# Patient Record
Sex: Female | Born: 1980 | Race: White | Hispanic: No | Smoking: Never smoker
Health system: Southern US, Community
[De-identification: ages and names within clinical notes are randomized; demographics above are authoritative.]

## PROBLEM LIST (undated history)

## (undated) DIAGNOSIS — R519 Headache, unspecified: Secondary | ICD-10-CM

## (undated) DIAGNOSIS — R51 Headache: Secondary | ICD-10-CM

## (undated) DIAGNOSIS — Z8669 Personal history of other diseases of the nervous system and sense organs: Secondary | ICD-10-CM

## (undated) DIAGNOSIS — T7840XA Allergy, unspecified, initial encounter: Secondary | ICD-10-CM

## (undated) HISTORY — DX: Personal history of other diseases of the nervous system and sense organs: Z86.69

## (undated) HISTORY — DX: Headache: R51

## (undated) HISTORY — DX: Headache, unspecified: R51.9

## (undated) HISTORY — DX: Allergy, unspecified, initial encounter: T78.40XA

---

## 2001-01-14 ENCOUNTER — Other Ambulatory Visit: Admission: RE | Admit: 2001-01-14 | Discharge: 2001-01-14 | Payer: Self-pay | Admitting: Obstetrics and Gynecology

## 2002-01-04 ENCOUNTER — Emergency Department (HOSPITAL_COMMUNITY): Admission: EM | Admit: 2002-01-04 | Discharge: 2002-01-04 | Payer: Self-pay | Admitting: Emergency Medicine

## 2002-01-04 ENCOUNTER — Other Ambulatory Visit: Admission: RE | Admit: 2002-01-04 | Discharge: 2002-01-04 | Payer: Self-pay | Admitting: Obstetrics and Gynecology

## 2002-11-21 ENCOUNTER — Emergency Department (HOSPITAL_COMMUNITY): Admission: EM | Admit: 2002-11-21 | Discharge: 2002-11-21 | Payer: Self-pay | Admitting: Emergency Medicine

## 2002-12-14 ENCOUNTER — Inpatient Hospital Stay (HOSPITAL_COMMUNITY): Admission: AD | Admit: 2002-12-14 | Discharge: 2002-12-18 | Payer: Self-pay | Admitting: Obstetrics and Gynecology

## 2002-12-15 ENCOUNTER — Encounter: Payer: Self-pay | Admitting: Obstetrics and Gynecology

## 2009-05-02 ENCOUNTER — Ambulatory Visit: Payer: Self-pay | Admitting: Family Medicine

## 2009-05-02 ENCOUNTER — Other Ambulatory Visit: Admission: RE | Admit: 2009-05-02 | Discharge: 2009-05-02 | Payer: Self-pay | Admitting: Family Medicine

## 2009-06-15 ENCOUNTER — Ambulatory Visit: Payer: Self-pay | Admitting: Family Medicine

## 2009-09-13 ENCOUNTER — Ambulatory Visit: Payer: Self-pay | Admitting: Family Medicine

## 2010-01-31 ENCOUNTER — Ambulatory Visit: Payer: Self-pay | Admitting: Family Medicine

## 2010-11-22 NOTE — Discharge Summary (Signed)
   NAME:  Jillian Valdez, Jillian Valdez                       ACCOUNT NO.:  [id number]   MEDICAL RECORD NO.:  [id number]                   PATIENT TYPE:  INP   LOCATION:  A325                                 FACILITY:  [facility]   PHYSICIAN:  Tilda Burrow, M.D.              DATE OF BIRTH:  [birth year]   DATE OF ADMISSION:  12/14/2002  DATE OF DISCHARGE:                                 DISCHARGE SUMMARY   ADMITTING DIAGNOSES:  Pelvic inflammatory disease (salpingitis).   DISCHARGE DIAGNOSES:  Questionable pelvic inflammatory disease  (salpingitis).   DISCHARGE MEDICATIONS:  Doxycycline 100 mg p.o. t.i.d. for 5 days--patient  and partner.   BRIEF HISTORY:  See HPI for lengthy details. This 30 year old female was  admitted for IV antibiotic therapy after 2 office visits for severe sharp  lower abdominal pain accompanied on the initial day with temperature to 102,  low-grade the day prior to admission with lower abdominal pain showing  bilateral guarding. The patient had a negative GC, Chlamydia, hepatitis,  HIV, and RPR in 2002.  Most recently she has had severe incapacitating  pelvic pain.  CBC and sedimentation rate were obtained upon admission.  The  patient was treated in July for pelvic discomfort, and in July 2003 with  Suprax and Zithromax for pelvic discomfort.   HOSPITAL COURSE:  The patient was admitted for what was suspected to be low-  grade, chronic PID.   Laboratory data included a white count of 6600, hemoglobin 11.5, hematocrit  32.9.  Sedimentation rate was performed December 16, 2002 and was completely  normal at 2 (normal 0-25).   The patient remained afebrile throughout the hospitalization.  Nonetheless  she improved slowly in her lower abdominal discomfort with the pain  lateralizing to the left side after 1 day with the right side perceived as  significantly less tender. Then, by the day of discharge she was able to  tolerate lower abdominal exam in both quadrants without any  guarding or  rebound.  The uncertain nature of whether she had PID since there were no  positive cultures was a bit of a diagnostic problem; but, we felt, that  discharge on the antibiotics for patient and partner was the prudent  discharge plan.   She will be followed up in 1 week for assessment including pelvic exam to  confirm complete resolution of symptoms and follow up p.r.n. Should  recurrent episodes of pain occur, diagnostic laparoscopy is a strong  consideration.                                               Tilda Burrow, M.D.    JVF/MEDQ  D:  12/18/2002  T:  12/18/2002  Job:  [id number]

## 2010-11-22 NOTE — H&P (Signed)
NAME:  Jillian Valdez, Jillian Valdez NO.:  [id number]   MEDICAL RECORD NO.:  [id number]                  PATIENT TYPE:   LOCATION:                                       FACILITY:   PHYSICIAN:  Tilda Burrow, M.D.              DATE OF BIRTH:   DATE OF ADMISSION:  DATE OF DISCHARGE:                                HISTORY & PHYSICAL   ADMISSION DIAGNOSIS:  Pelvic inflammatory disease (salpingitis).   HISTORY OF PRESENT ILLNESS:  This 30 year old female was admitted for IV  antibiotic therapy after being seen in our office both June 7 and December 14, 2002, for severe sharp low abdominal pain in the stomach and lower back,  which has been acutely worse since Saturday, December 10, 2002, when the patient  had a temperature to 102 with the patient both having vomiting on December 11, 2002, with two loose bowel movements that night.  Her temperature was  improved on June 6 with a temperature maximum at 100.0 but staying high of  the 24 hours.  Jillian Valdez had been seen in our office two months before  complaining of irregular cycle issues and had a diagnosis of dysfunctional  bleeding and suspected vaginal infection due to uterine tenderness and was  treated with doxycycline 100 mg b.i.d. for seven days.  There is no mention  of treatment of partner.  The patient has been in a single-partner  relationship for six months, and previously was married and had only one  partner for four years.  Incidentally, there was a reason for the break-up  of the marriage.   The break-up occurred in September 2004.  Screening in September 2004 was  performed for all STDs and at that point in time, she had negative GC,  Chlamydia, hepatitis, HIV, and RPR.  To her knowledge, her current  relationship is mutually monogamous.  GYN history also notable for  dysfunctional uterine bleeding during early July, notable for a treatment of  January 04, 2002, with Suprax and  Zithromax for pelvic discomfort.   The recent pain has been the most severely incapacitating pelvic pain ever,  and on exam the patient has referred pain from the upper abdomen to the  lower abdomen on palpation and has guarding in both lower quadrants and no  rebound tenderness.  CBC and sedimentation rate are pending.  The patient is  afebrile.   GENERAL:  A slim, healthy-appearing Caucasian female, alert and oriented x3,  in obvious discomfort, able to walk slowly, leaning forward in  characteristic fashion.  ABDOMEN:  Bowel sounds in upper quadrants greater than lower quadrants,  guarding in both lower quadrants without rebound tenderness.  PELVIC:  External genitalia shows some folliculitis around the areas of  shaving of the labia majora.  Periurethral tissues are normal.  The cervix  has a heavy mucoid discharge without purulent.  The uterus is anteflexed,  tenderness of  the cervix is 3/5.  Adnexa are diffusely tender without  rebound or masses.   IMPRESSION:  Low-grade pelvic inflammatory disease, possible long-standing  duration.   PLAN:  1. Admit to hospital.  2. Follow up CBC and sedimentation rate.  3.     IV doxycycline.  4. Antibiotic treatment as well as outpatient therapy to follow.  5. If the patient is not better in 48-72 hours, will consider laparoscopy.                                               Tilda Burrow, M.D.    JVF/MEDQ  D:  12/14/2002  T:  12/14/2002  Job:  [id number]

## 2011-05-01 ENCOUNTER — Encounter: Payer: Self-pay | Admitting: Family Medicine

## 2014-01-24 ENCOUNTER — Emergency Department (HOSPITAL_COMMUNITY): Payer: 59

## 2014-01-24 ENCOUNTER — Emergency Department (HOSPITAL_COMMUNITY)
Admission: EM | Admit: 2014-01-24 | Discharge: 2014-01-24 | Disposition: A | Discharge status: Home / Self Care | Payer: 59 | Attending: Emergency Medicine | Admitting: Emergency Medicine

## 2014-01-24 ENCOUNTER — Encounter (HOSPITAL_COMMUNITY): Payer: Self-pay | Admitting: Emergency Medicine

## 2014-01-24 DIAGNOSIS — S12000A Unspecified displaced fracture of first cervical vertebra, initial encounter for closed fracture: Secondary | ICD-10-CM

## 2014-01-24 DIAGNOSIS — Z8679 Personal history of other diseases of the circulatory system: Secondary | ICD-10-CM | POA: Insufficient documentation

## 2014-01-24 DIAGNOSIS — Y929 Unspecified place or not applicable: Secondary | ICD-10-CM | POA: Insufficient documentation

## 2014-01-24 DIAGNOSIS — R079 Chest pain, unspecified: Secondary | ICD-10-CM | POA: Insufficient documentation

## 2014-01-24 DIAGNOSIS — Y9389 Activity, other specified: Secondary | ICD-10-CM | POA: Insufficient documentation

## 2014-01-24 DIAGNOSIS — Z3202 Encounter for pregnancy test, result negative: Secondary | ICD-10-CM | POA: Insufficient documentation

## 2014-01-24 DIAGNOSIS — X500XXA Overexertion from strenuous movement or load, initial encounter: Secondary | ICD-10-CM | POA: Insufficient documentation

## 2014-01-24 DIAGNOSIS — Z8709 Personal history of other diseases of the respiratory system: Secondary | ICD-10-CM | POA: Insufficient documentation

## 2014-01-24 LAB — POC URINE PREG, ED: Preg Test, Ur: NEGATIVE

## 2014-01-24 MED ORDER — METHOCARBAMOL 500 MG PO TABS: 500.0000 mg | ORAL_TABLET | Freq: Three times a day (TID) | ORAL | Status: DC

## 2014-01-24 MED ORDER — OXYCODONE-ACETAMINOPHEN 5-325 MG PO TABS: 1.0000 | ORAL_TABLET | Freq: Four times a day (QID) | ORAL | Status: DC | PRN

## 2014-01-24 MED ORDER — DICLOFENAC SODIUM 75 MG PO TBEC: 75.0000 mg | DELAYED_RELEASE_TABLET | Freq: Two times a day (BID) | ORAL | Status: DC

## 2014-01-24 NOTE — ED Notes (Signed)
Neck pain on Saturday and hear popping after sudden movement.  Rates pain 10.

## 2014-01-24 NOTE — ED Provider Notes (Signed)
Medical screening examination/treatment/procedure(s) were conducted as a shared visit with non-physician practitioner(s) and myself.  I personally evaluated the patient during the encounter.  33 year old female suddenly turned her head to the right 3 days ago and has had neck pain since that time with some radiation toward her left upper arm with some slight numbness and tingling to her left upper arm without weakness to her arms or legs without change in bowel or bladder function and without numbness or tingling to her hands or feet. CT scan shows essentially nondisplaced lateral fracture of the right side of C1.    Hurman HornJohn M Barbarajean Kinzler, MD 01/28/14 [phone]

## 2014-01-24 NOTE — Discharge Instructions (Signed)
Your CT scan suggests a fracture of your C1 vertebrae. Please leave the Philadelphia collar in place until seen by neurosurgery. Please use Robaxin and diclofenac daily. Use Percocet every 6 hours if needed for pain. Please take these medications with. Robaxin and Percocet may cause drowsiness, please use with caution. Please see Dr. Venetia MaxonStern (neurosurgery) in one week. Please return to the emergency department if any emergent changes, problems, or concerns.
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
## 2015-06-12 ENCOUNTER — Encounter: Payer: Self-pay | Admitting: *Deleted

## 2015-06-13 ENCOUNTER — Encounter: Payer: Self-pay | Admitting: Diagnostic Neuroimaging

## 2015-06-13 ENCOUNTER — Ambulatory Visit (INDEPENDENT_AMBULATORY_CARE_PROVIDER_SITE_OTHER): Payer: BLUE CROSS/BLUE SHIELD | Admitting: Diagnostic Neuroimaging

## 2015-06-13 VITALS — BP 111/67 | HR 73 | Ht 68.0 in | Wt 146.0 lb

## 2015-06-13 DIAGNOSIS — G43709 Chronic migraine without aura, not intractable, without status migrainosus: Secondary | ICD-10-CM | POA: Diagnosis not present

## 2015-06-13 MED ORDER — GABAPENTIN 300 MG PO CAPS: 300.0000 mg | ORAL_CAPSULE | Freq: Three times a day (TID) | ORAL | Status: DC

## 2015-06-13 NOTE — Patient Instructions (Signed)
Thank you for coming to see [place] at [facility]. I hope we have been able to provide you high quality care today.  You may receive a patient satisfaction survey over the next few weeks. We would appreciate your feedback and comments so that we may continue to improve ourselves and the health of our patients.  - start gabapentin 329m at bedtime; after 1-2 weeks increase to twice a day   ~~~~~~~~~~~~~~~~~~~~~~~~~~~~~~~~~~~~~~~~~~~~~~~~~~~~~~~~~~~~~~~~~  DR. Evona Westra'S GUIDE TO HAPPY AND HEALTHY LIVING These are some of my general health and wellness recommendations. Some of them may apply to you better than others. Please use common sense as you try these suggestions and feel free to ask me any questions.   ACTIVITY/FITNESS Mental, social, emotional and physical stimulation are very important for brain and body health. Try learning a new activity (arts, music, language, sports, games).  Keep moving your body to the best of your abilities. You can do this at home, inside or outside, the park, community center, gym or anywhere you like. Consider a physical therapist or personal trainer to get started. Consider the app Sworkit. Fitness trackers such as smart-watches, smart-phones or Fitbits can help as well.   NUTRITION Eat more plants: colorful vegetables, nuts, seeds and berries.  Eat less sugar, salt, preservatives and processed foods.  Avoid toxins such as cigarettes and alcohol.  Drink water when you are thirsty. Warm water with a slice of lemon is an excellent morning drink to start the day.  Consider these websites for more information The Nutrition Source (hhttps://www.henry-hernandez.biz/ Precision Nutrition (wWindowBlog.ch   RELAXATION Consider practicing mindfulness meditation or other relaxation techniques such as deep breathing, prayer, yoga, tai chi, massage. See website mindful.org or the apps Headspace or Calm  to help get started.   SLEEP Try to get at least 7-8+ hours sleep per day. Regular exercise and reduced caffeine will help you sleep better. Practice good sleep hygeine techniques. See website sleep.org for more information.   PLANNING Prepare estate planning, living will, healthcare POA documents. Sometimes this is best planned with the help of an attorney. Theconversationproject.org and agingwithdignity.org are excellent resources.
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
## 2015-07-08 IMAGING — CT CT CERVICAL SPINE W/O CM
3 of 4 series · 12 of 33 positions shown, 14 images · non-contrast
Comparison: None.

CLINICAL DATA: Neck pain after quickly turning head for 3 days,
heard a pop now with severe pain with rotation of neck and pulling
sensation to left shoulder ; history of neck fracture 10 years ago

EXAM:
CT CERVICAL SPINE WITHOUT CONTRAST
TECHNIQUE: Multidetector CT imaging of the cervical spine was performed without
intravenous contrast. Multiplanar CT image reconstructions were also
generated.

[Series 3: cervical 2.0 st axial · axial · 0.27mm/px · z∈[+1060,+1164]mm · 4 of 79 slices shown, 5 images]
[im 14/79  soft-tissue]
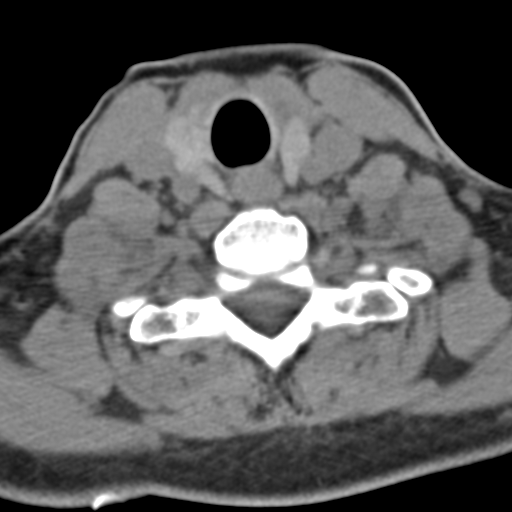
[im 14/79  bone]
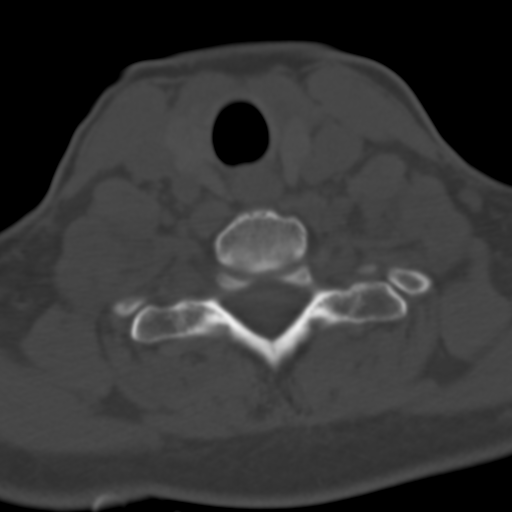
[im 27/79  bone]
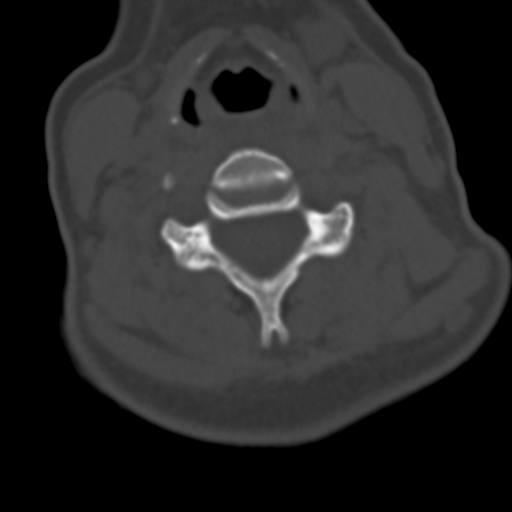
[im 53/79  bone]
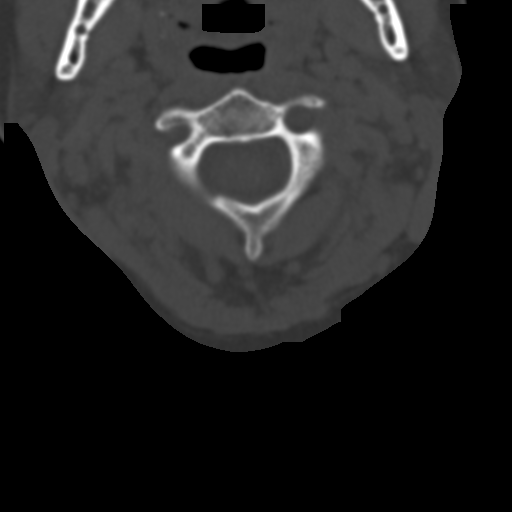
[im 66/79  bone]
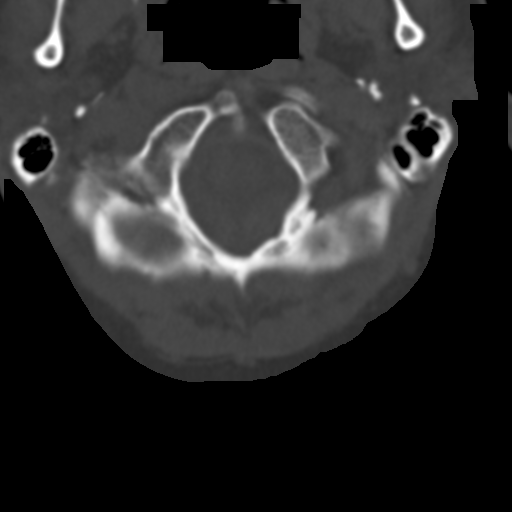

[Series 5: cervical spine sagittal bone · sagittal · 0.21mm/px · 5 of 40 slices shown, 6 images]
[im 14/40  bone]
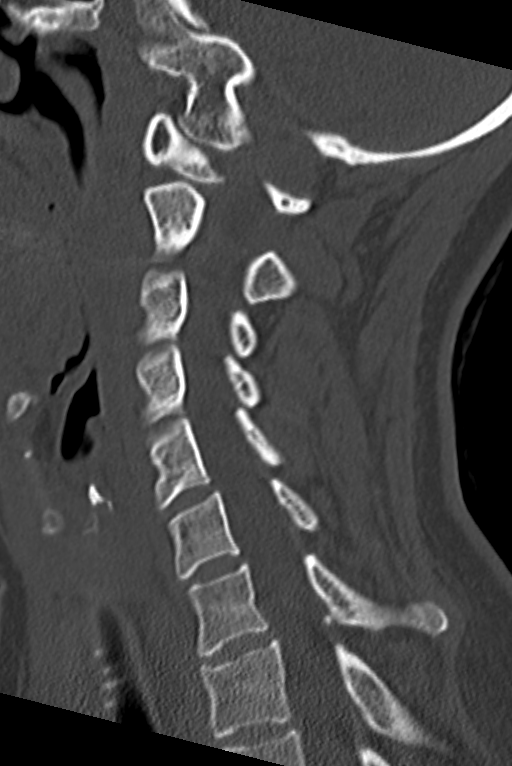
[im 17/40  bone]
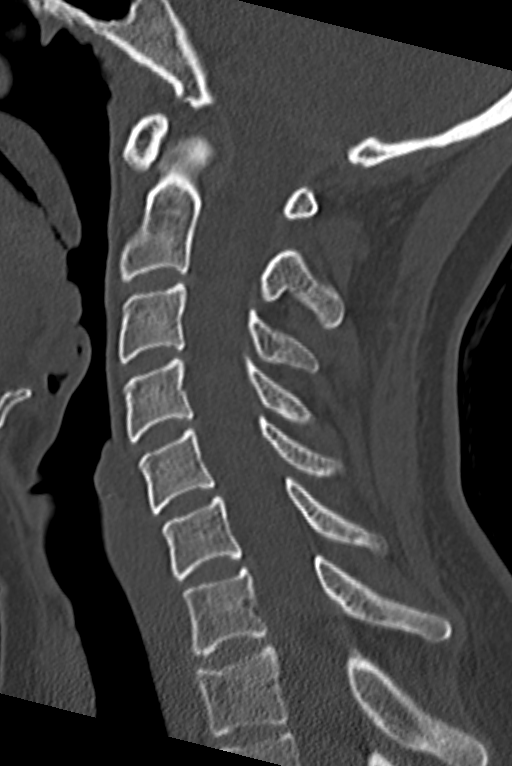
[im 20/40  soft-tissue]
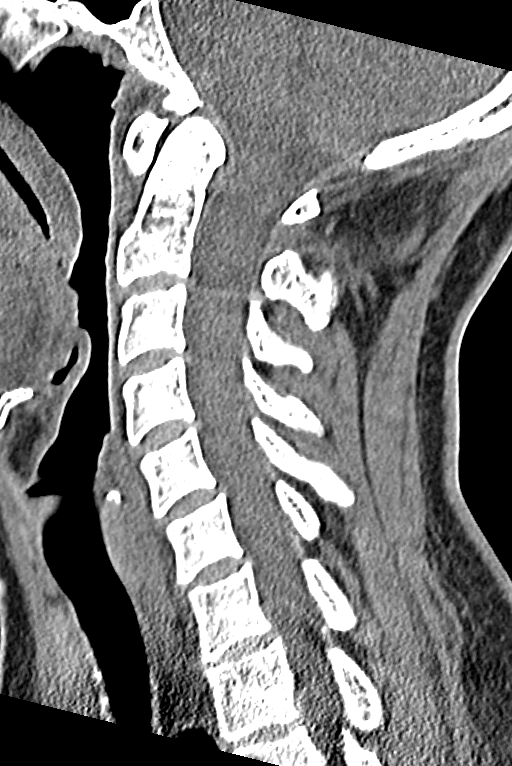
[im 20/40  bone]
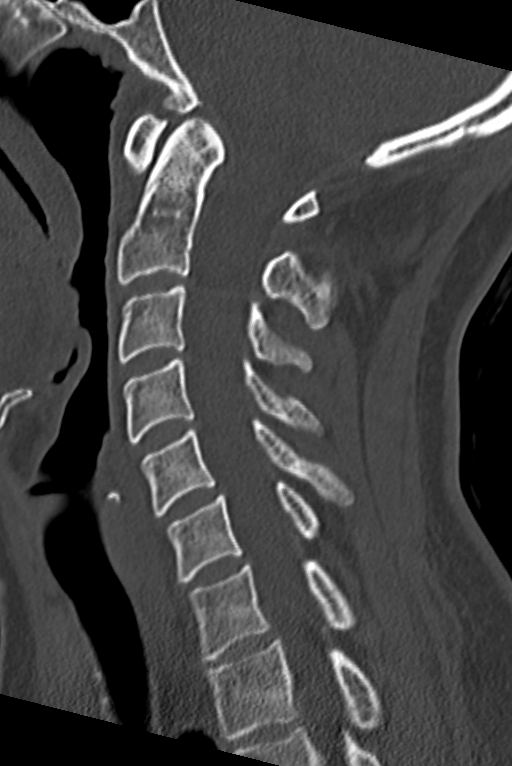
[im 23/40  bone]
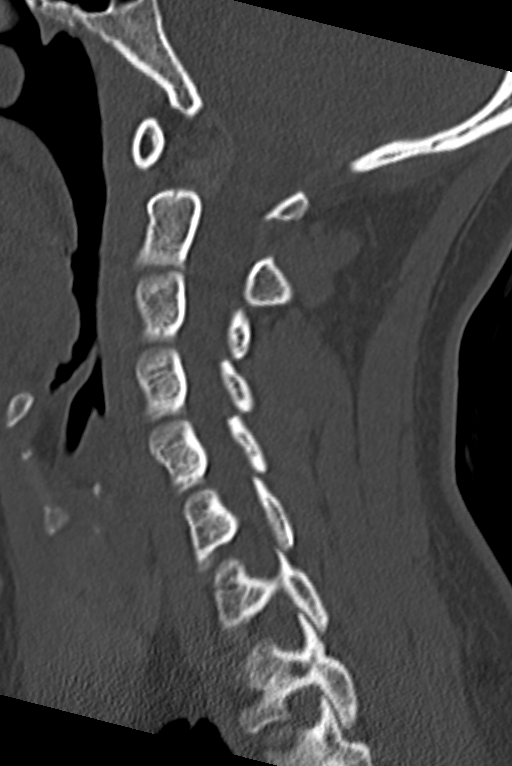
[im 27/40  bone]
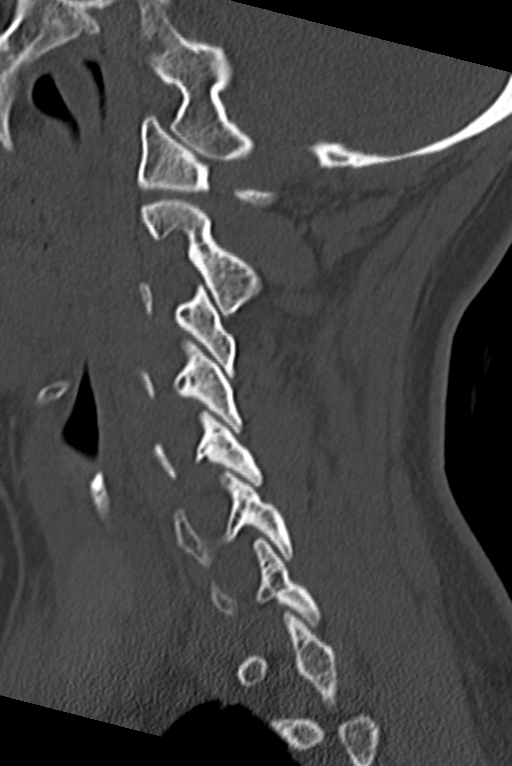

[Series 6: cervical spine coronal bone · coronal · 0.19mm/px · 3 of 48 slices shown]
[im 10/48  bone]
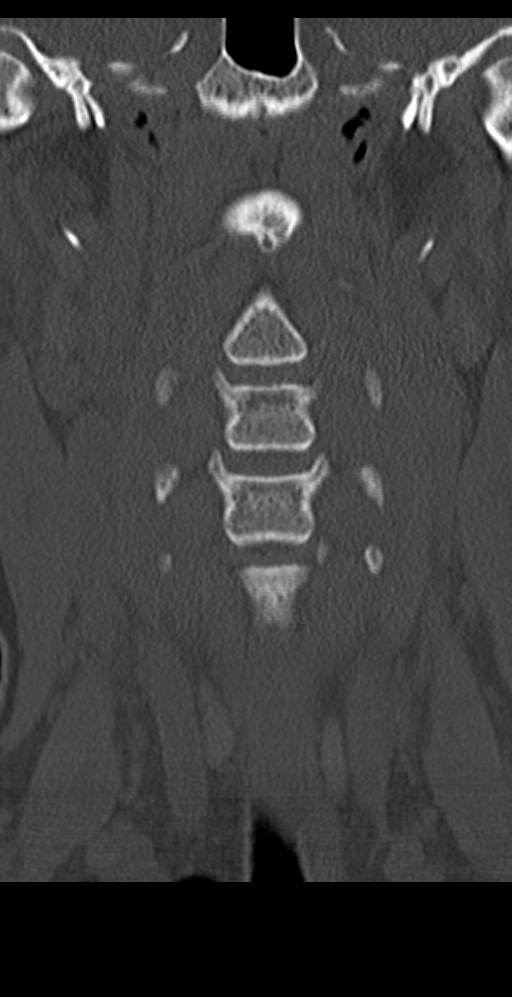
[im 19/48  bone]
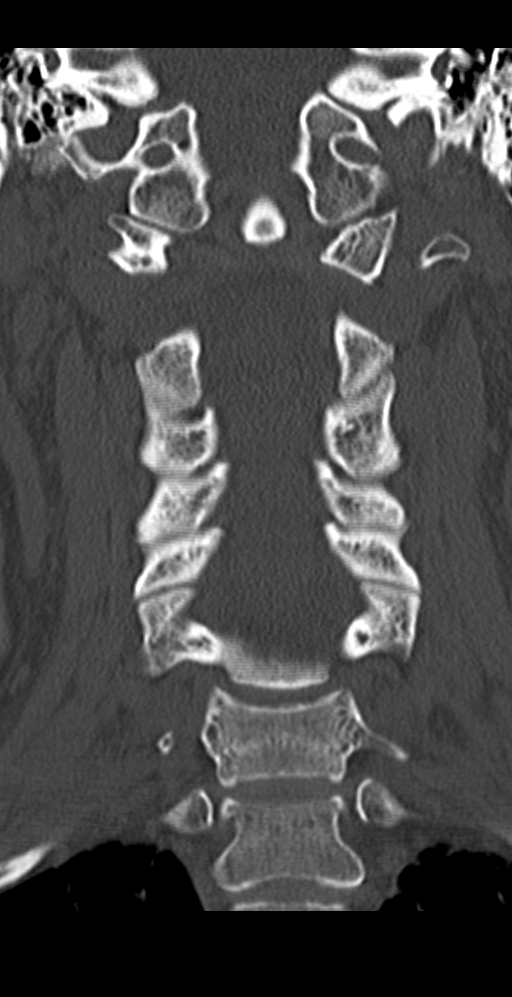
[im 29/48  bone]
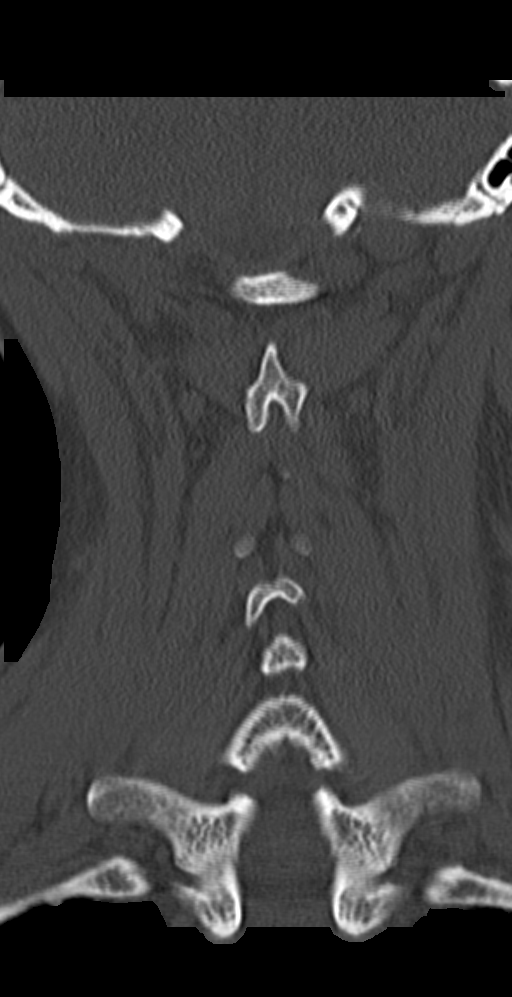

[12 of 33 positions shown; findings below may reference images not displayed]

FINDINGS: No acute soft tissue abnormalities. Mild nodularity of the thyroid
gland. Lung apices clear.

There is an acute appearing fracture of the posterior arch of C1
laterally. Fractures minimally displaced. No other fractures are
evident. There is normal anterior-posterior alignment of the
cervical spine.
IMPRESSION: Acute fracture of the posterior arch of C1. Critical Value/emergent
results were called by telephone at the time of interpretation on
01/24/2014 at [DATE] to Dr. BORIS CUCA HERRIAU , who verbally
acknowledged these results.

## 2015-07-30 ENCOUNTER — Telehealth: Payer: Self-pay | Admitting: Diagnostic Neuroimaging

## 2015-07-30 NOTE — Telephone Encounter (Signed)
Per Dr Marjory Lies, spoke with patient and informed her to increase Gabapentin to 300 mg in morning and at lunch and 600 mg at night. Advised she try for 1 week before increasing again, but explained she can increase up to 600 mg three x a day. Advised she let this office know if she continues to increase so new prescription can be sent to her pharmacy. She verbalized understanding, appreciation.

## 2015-07-30 NOTE — Telephone Encounter (Signed)
Spoke with patient who states she increased Gabapentin to 300 mg tid in mid Dec. She states it has not helped her headaches at all. She stated she has HA every day. She has not taken any other medications except Benadryl for her HA. She also stated she has left sided neck pain that "feels like pinching", even when looking straight forward. She is requesting another medication to help her pain. Confirmed pharmacy as [facility], [place]. Informed her would route her request to Dr Marjory Lies and call her back. She verbalized understanding, appreciation.

## 2015-07-30 NOTE — Telephone Encounter (Signed)
Increase gabapentin to  TID; in future may consider PT or pain mgmt. -VRP

## 2015-07-30 NOTE — Telephone Encounter (Signed)
Patient is calling and states that the Rx Gabapentin 300 mg capsules is not helping her headaches.  She states it feels like a pinching in the neck on left side near her broken vertebrae. Please call.

## 2015-08-30 MED ORDER — GABAPENTIN 300 MG PO CAPS: 600.0000 mg | ORAL_CAPSULE | Freq: Three times a day (TID) | ORAL | Status: DC

## 2015-08-30 NOTE — Addendum Note (Signed)
Addended by: [place] Pink on: 08/30/2015 03:41 PM   Modules accepted: Orders

## 2015-08-30 NOTE — Telephone Encounter (Addendum)
Pt has increased her medication to 600 mg 3 x a day and needs a new rx sent in to reflect this. May call pt [phone]. Please send to [facility] on [address] .

## 2015-08-30 NOTE — Telephone Encounter (Signed)
Spoke with patient and informed her refill sent in for 1 month; she will need to come for FU 09/11/15 for add'l refills. She verbalized understanding, appreciation.

## 2015-09-11 ENCOUNTER — Encounter: Payer: Self-pay | Admitting: Diagnostic Neuroimaging

## 2015-09-11 ENCOUNTER — Ambulatory Visit (INDEPENDENT_AMBULATORY_CARE_PROVIDER_SITE_OTHER): Payer: BLUE CROSS/BLUE SHIELD | Admitting: Diagnostic Neuroimaging

## 2015-09-11 VITALS — BP 118/78 | HR 76 | Wt 155.0 lb

## 2015-09-11 DIAGNOSIS — G43709 Chronic migraine without aura, not intractable, without status migrainosus: Secondary | ICD-10-CM | POA: Diagnosis not present

## 2015-09-11 MED ORDER — GABAPENTIN 300 MG PO CAPS: 600.0000 mg | ORAL_CAPSULE | Freq: Three times a day (TID) | ORAL | Status: DC
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
## 2015-09-11 NOTE — Patient Instructions (Signed)
Thank you for coming to see [place] at [facility]. I hope we have been able to provide you high quality care today.  You may receive a patient satisfaction survey over the next few weeks. We would appreciate your feedback and comments so that we may continue to improve ourselves and the health of our patients.  - increase gabapentin up to 937m three times per day - may consider migraine research study in April 2017   ~~~~~~~~~~~~~~~~~~~~~~~~~~~~~~~~~~~~~~~~~~~~~~~~~~~~~~~~~~~~~~~~~  DR. PENUMALLI'S GUIDE TO HAPPY AND HEALTHY LIVING These are some of my general health and wellness recommendations. Some of them may apply to you better than others. Please use common sense as you try these suggestions and feel free to ask me any questions.   ACTIVITY/FITNESS Mental, social, emotional and physical stimulation are very important for brain and body health. Try learning a new activity (arts, music, language, sports, games).  Keep moving your body to the best of your abilities. You can do this at home, inside or outside, the park, community center, gym or anywhere you like. Consider a physical therapist or personal trainer to get started. Consider the app Sworkit. Fitness trackers such as smart-watches, smart-phones or Fitbits can help as well.   NUTRITION Eat more plants: colorful vegetables, nuts, seeds and berries.  Eat less sugar, salt, preservatives and processed foods.  Avoid toxins such as cigarettes and alcohol.  Drink water when you are thirsty. Warm water with a slice of lemon is an excellent morning drink to start the day.  Consider these websites for more information The Nutrition Source (hhttps://www.henry-hernandez.biz/ [employer] Nutrition (wWindowBlog.ch   RELAXATION Consider practicing mindfulness meditation or other relaxation techniques such as deep breathing, prayer, yoga, tai chi, massage. See website mindful.org  or the apps Headspace or Calm to help get started.   SLEEP Try to get at least 7-8+ hours sleep per day. Regular exercise and reduced caffeine will help you sleep better. Practice good sleep hygeine techniques. See website sleep.org for more information.   PLANNING Prepare estate planning, living will, healthcare POA documents. Sometimes this is best planned with the help of an attorney. Theconversationproject.org and agingwithdignity.org are excellent resources.

## 2015-12-07 ENCOUNTER — Ambulatory Visit (INDEPENDENT_AMBULATORY_CARE_PROVIDER_SITE_OTHER): Payer: BLUE CROSS/BLUE SHIELD | Admitting: Nurse Practitioner

## 2015-12-07 ENCOUNTER — Encounter: Payer: Self-pay | Admitting: Nurse Practitioner

## 2015-12-07 VITALS — BP 110/67 | HR 81 | Ht 68.0 in | Wt 168.0 lb

## 2015-12-07 DIAGNOSIS — G43909 Migraine, unspecified, not intractable, without status migrainosus: Secondary | ICD-10-CM | POA: Diagnosis not present

## 2015-12-07 DIAGNOSIS — R110 Nausea: Secondary | ICD-10-CM

## 2015-12-07 MED ORDER — ONDANSETRON HCL 4 MG PO TABS: 4.0000 mg | ORAL_TABLET | Freq: Three times a day (TID) | ORAL | Status: DC | PRN

## 2015-12-07 MED ORDER — CYCLOBENZAPRINE HCL 10 MG PO TABS: ORAL_TABLET | ORAL | Status: DC

## 2015-12-07 NOTE — Patient Instructions (Addendum)
Continue Neurontin at current dose,  Stop Tylenol Try Flexeril one half tablet during the day and full tab at night Zofran for nausea Follow-up in 3 months
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
## 2015-12-12 ENCOUNTER — Ambulatory Visit: Payer: BLUE CROSS/BLUE SHIELD | Admitting: Diagnostic Neuroimaging

## 2016-03-17 ENCOUNTER — Encounter: Payer: Self-pay | Admitting: Nurse Practitioner

## 2016-03-17 ENCOUNTER — Ambulatory Visit (INDEPENDENT_AMBULATORY_CARE_PROVIDER_SITE_OTHER): Payer: BLUE CROSS/BLUE SHIELD | Admitting: Nurse Practitioner

## 2016-03-17 VITALS — BP 108/80 | HR 76 | Ht 68.0 in | Wt 173.8 lb

## 2016-03-17 DIAGNOSIS — G43909 Migraine, unspecified, not intractable, without status migrainosus: Secondary | ICD-10-CM | POA: Diagnosis not present

## 2016-03-17 DIAGNOSIS — G8929 Other chronic pain: Secondary | ICD-10-CM | POA: Diagnosis not present

## 2016-03-17 DIAGNOSIS — M542 Cervicalgia: Secondary | ICD-10-CM

## 2016-03-17 MED ORDER — GABAPENTIN 300 MG PO CAPS: 600.0000 mg | ORAL_CAPSULE | Freq: Three times a day (TID) | ORAL | 6 refills | Status: DC

## 2016-03-17 MED ORDER — CYCLOBENZAPRINE HCL 10 MG PO TABS: 10.0000 mg | ORAL_TABLET | Freq: Three times a day (TID) | ORAL | 6 refills | Status: DC
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
## 2016-03-17 NOTE — Patient Instructions (Addendum)
Decrease gabapentin to 600 mg 3 times daily Increase Flexeril to 10 mg 3 times daily Given neck exercises to perform Follow up in 4 to 6 months

## 2016-03-19 ENCOUNTER — Other Ambulatory Visit: Payer: Self-pay | Admitting: Nurse Practitioner

## 2016-08-18 ENCOUNTER — Ambulatory Visit: Payer: BLUE CROSS/BLUE SHIELD | Admitting: Nurse Practitioner

## 2016-09-02 ENCOUNTER — Ambulatory Visit: Payer: BLUE CROSS/BLUE SHIELD | Admitting: Nurse Practitioner

## 2016-09-03 ENCOUNTER — Encounter: Payer: Self-pay | Admitting: Nurse Practitioner

## 2017-02-19 ENCOUNTER — Telehealth: Payer: Self-pay | Admitting: Nurse Practitioner

## 2017-02-19 NOTE — Telephone Encounter (Signed)
Appointment Request From: Anette GuarneriHeather Keathley    With Provider: Nilda RiggsMARTIN,NANCY CAROLYN, NP [[facility]]    Preferred Date Range: From 02/19/2017 To 02/27/2017    Preferred Times: Any    Reason for visit: Office Visit    Comments:  Headaches are still occurring. Pain in my neck/spine. My spine always feels bruised/tender. The neck pain feels as if my neck is in a vice. I quit at the end of September cold [place] the Gabapentin was making my body retain to much fluid. Ended up gaining 30 lbs. Still didn't get any relief from the medicine.

## 2017-02-19 NOTE — Telephone Encounter (Signed)
Spoke to pt and made appt for 02-23-17 at 0815, be here 15min prior to appt. She verbalized understanding.  Not taking gabapentin caused fluid retention.
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
## 2017-02-23 ENCOUNTER — Ambulatory Visit (INDEPENDENT_AMBULATORY_CARE_PROVIDER_SITE_OTHER): Payer: BLUE CROSS/BLUE SHIELD | Admitting: Nurse Practitioner

## 2017-02-23 ENCOUNTER — Encounter: Payer: Self-pay | Admitting: Nurse Practitioner

## 2017-02-23 VITALS — BP 109/71 | HR 82 | Ht 68.0 in | Wt 159.6 lb

## 2017-02-23 DIAGNOSIS — R200 Anesthesia of skin: Secondary | ICD-10-CM

## 2017-02-23 DIAGNOSIS — G43909 Migraine, unspecified, not intractable, without status migrainosus: Secondary | ICD-10-CM | POA: Diagnosis not present

## 2017-02-23 DIAGNOSIS — M542 Cervicalgia: Secondary | ICD-10-CM

## 2017-02-23 DIAGNOSIS — G8929 Other chronic pain: Secondary | ICD-10-CM

## 2017-02-23 NOTE — Patient Instructions (Addendum)
Continue  neck exercises  Will get EMG/[facility] for continued neck pain Continue Tizanidine 4mg  can take up to 3 times daily Follow up in 4 to 6 months  Cervical Radiculopathy Cervical radiculopathy happens when a nerve in the neck (cervical nerve) is pinched or bruised. This condition can develop because of an injury or as part of the normal aging process. Pressure on the cervical nerves can cause pain or numbness that runs from the neck all the way down into the arm and fingers. Usually, this condition gets better with rest. Treatment may be needed if the condition does not improve. What are the causes? This condition may be caused by:  Injury.  Slipped (herniated) disk.  Muscle tightness in the neck because of overuse.  Arthritis.  Breakdown or degeneration in the bones and joints of the spine (spondylosis) due to aging.  Bone spurs that may develop near the cervical nerves.  What are the signs or symptoms? Symptoms of this condition include:  Pain that runs from the neck to the arm and hand. The pain can be severe or irritating. It may be worse when the neck is moved.  Numbness or weakness in the affected arm and hand.  How is this diagnosed? This condition may be diagnosed based on symptoms, medical history, and a physical exam. You may also have tests, including:  X-rays.  CT scan.  MRI.  Electromyogram (EMG).  Nerve conduction tests.  How is this treated? In many cases, treatment is not needed for this condition. With rest, the condition usually gets better over time. If treatment is needed, options may include:  Wearing a soft neck collar for short periods of time.  Physical therapy to strengthen your neck muscles.  Medicines, such as NSAIDs, oral corticosteroids, or spinal injections.  Surgery. This may be needed if other treatments do not help. Various types of surgery may be done depending on the cause of your problems.  Follow these instructions at  home: Managing pain  Take over-the-counter and prescription medicines only as told by your health care provider.  If directed, apply ice to the affected area. ? Put ice in a plastic bag. ? Place a towel between your skin and the bag. ? Leave the ice on for 20 minutes, 2-3 times per day.  If ice does not help, you can try using heat. Take a warm shower or warm bath, or use a heat pack as told by your health care provider.  Try a gentle neck and shoulder massage to help relieve symptoms. Activity  Rest as needed. Follow instructions from your health care provider about any restrictions on activities.  Do stretching and strengthening exercises as told by your health care provider or physical therapist. General instructions  If you were given a soft collar, wear it as told by your health care provider.  Use a flat pillow when you sleep.  Keep all follow-up visits as told by your health care provider. This is important. Contact a health care provider if:  Your condition does not improve with treatment. Get help right away if:  Your pain gets much worse and cannot be controlled with medicines.  You have weakness or numbness in your hand, arm, face, or leg.  You have a high fever.  You have a stiff, rigid neck.  You lose control of your bowels or your bladder (have incontinence).  You have trouble with walking, balance, or speaking. This information is not intended to replace advice given to you by  your health care provider. Make sure you discuss any questions you have with your health care provider. Document Released: 03/18/2001 Document Revised: 11/29/2015 Document Reviewed: 08/17/2014 Elsevier Interactive Patient Education  [employer].

## 2017-02-23 NOTE — Progress Notes (Signed)
I reviewed note and agree with plan.   Suanne Marker, MD 02/23/2017, 6:48 PM Certified in Neurology, Neurophysiology and Neuroimaging  [facility] [address], [address] [place], [place] [address] [phone]

## 2017-02-24 ENCOUNTER — Telehealth: Payer: Self-pay | Admitting: Nurse Practitioner

## 2017-02-24 ENCOUNTER — Encounter: Payer: Self-pay | Admitting: *Deleted

## 2017-02-24 NOTE — Telephone Encounter (Signed)
It would probably help please write

## 2017-02-24 NOTE — Telephone Encounter (Signed)
Patient calling to get a letter stating a breast reduction would help with her neck and shoulder pain. Please fax to patient's work at [phone].

## 2017-02-25 NOTE — Telephone Encounter (Addendum)
Fax confirmation received [phone].   Pt states did receive.

## 2017-03-05 ENCOUNTER — Encounter (INDEPENDENT_AMBULATORY_CARE_PROVIDER_SITE_OTHER): Payer: Self-pay | Admitting: Diagnostic Neuroimaging

## 2017-03-05 ENCOUNTER — Ambulatory Visit (INDEPENDENT_AMBULATORY_CARE_PROVIDER_SITE_OTHER): Payer: BLUE CROSS/BLUE SHIELD | Admitting: Diagnostic Neuroimaging

## 2017-03-05 DIAGNOSIS — M542 Cervicalgia: Principal | ICD-10-CM

## 2017-03-05 DIAGNOSIS — R200 Anesthesia of skin: Secondary | ICD-10-CM

## 2017-03-05 DIAGNOSIS — Z0289 Encounter for other administrative examinations: Secondary | ICD-10-CM

## 2017-03-05 DIAGNOSIS — G8929 Other chronic pain: Secondary | ICD-10-CM

## 2017-03-06 ENCOUNTER — Telehealth: Payer: Self-pay | Admitting: Nurse Practitioner

## 2017-03-06 MED ORDER — TIZANIDINE HCL 4 MG PO TABS: 4.0000 mg | ORAL_TABLET | Freq: Three times a day (TID) | ORAL | 5 refills | Status: AC | PRN

## 2017-03-06 NOTE — Telephone Encounter (Signed)
Done

## 2017-03-06 NOTE — Telephone Encounter (Signed)
Pt called she said Eber JonesCarolyn told her she would refill tiZANidine (ZANAFLEX) 4 MG tablet when she almost out, she will be out on Sunday. [facility]

## 2017-03-06 NOTE — Procedures (Signed)
[facility]  NCS (NERVE CONDUCTION STUDY) WITH EMG (ELECTROMYOGRAPHY) REPORT   STUDY DATE: 03/05/17 PATIENT NAME: Jillian Valdez DOB: 08-Feb-1981 MRN: [id number]  ORDERING CLINICIAN: Nilda Riggs, NP  TECHNOLOGIST: Charlesetta Ivory ELECTROMYOGRAPHER: Glenford Bayley. Draylon Mercadel, MD  CLINICAL INFORMATION: 36 year old female here for evaluation of left arm numbness and pain.  FINDINGS: NERVE CONDUCTION STUDY: Left median, left ulnar motor responses are normal.  Left median, left ulnar sensory responses are normal. Left median to ulnar transcarpal comparison is normal.   NEEDLE ELECTROMYOGRAPHY: Needle examination of left upper extremity deltoid, biceps, triceps, flexor carpi radialis, first dorsal interosseous is normal.  Left cervical paraspinal muscles are normal.   IMPRESSION:   This is a normal study. No electrodiagnostic evidence of large fiber neuropathy or cervical radiculopathy at this time.    INTERPRETING PHYSICIAN:  Suanne Marker, MD Certified in Neurology, Neurophysiology and Neuroimaging  [facility] [address], [address] [place], [place] [address] [phone]   [facility]    Nerve / Sites Muscle Latency Ref. Amplitude Ref. Rel Amp Segments Distance Velocity Ref. Area    ms ms mV mV %  cm m/s m/s mVms  L Median - APB     Wrist APB 2.9 ?4.4 9.2 ?4.0 100 Wrist - APB 7   38.9     Upper arm APB 6.5  9.2  100 Upper arm - Wrist 20 56 ?49 35.4  L Ulnar - ADM     Wrist ADM 2.3 ?3.3 9.3 ?6.0 100 Wrist - ADM 7   32.9     B.Elbow ADM 5.3  9.1  97.8 B.Elbow - Wrist 16 55 ?49 33.0     A.Elbow ADM 7.0  8.3  90.6 A.Elbow - B.Elbow 10 58 ?49 32.0         A.Elbow - Wrist             SNC    Nerve / Sites Rec. Site Peak Lat Ref.  Amp Ref. Segments Distance Peak Diff Ref.    ms ms V V  cm ms ms  L Median, Ulnar - Transcarpal comparison     Median Palm Wrist 1.8 ?2.2 77 ?35 Median Palm - Wrist 8       Ulnar Palm Wrist 1.9 ?2.2  46 ?12 Ulnar Palm - Wrist 8          Median Palm - Ulnar Palm  -0.1 ?0.4  L Median - Orthodromic (Dig II, Mid palm)     Dig II Wrist 3.2 ?3.4 19 ?10 Dig II - Wrist 13    L Ulnar - Orthodromic, (Dig V, Mid palm)     Dig V Wrist 2.7 ?3.1 18 ?5 Dig V - Wrist 12             F  Wave    Nerve F Lat Ref.   ms ms  L Ulnar - ADM 25.1 ?32.0       EMG full       EMG Summary Table    Spontaneous MUAP Recruitment  Muscle IA Fib PSW Fasc Other Amp Dur. Poly Pattern  L. Cervical paraspinals Normal None None None _______ Normal Normal Normal Normal  L. Deltoid Normal None None None _______ Normal Normal Normal Normal  L. Biceps brachii Normal None None None _______ Normal Normal Normal Normal  L. Triceps brachii Normal None None None _______ Normal Normal Normal Normal  L. Flexor carpi radialis Normal None None None _______ Normal Normal Normal Normal  L. First dorsal interosseous Normal None None None _______ Normal Normal Normal Normal

## 2017-03-10 ENCOUNTER — Telehealth: Payer: Self-pay | Admitting: *Deleted

## 2017-03-10 NOTE — Telephone Encounter (Signed)
Spoke with patient and informed her that her EMG/NCS was normal. She had no questions, verbalized understanding, appreciation.

## 2017-04-11 ENCOUNTER — Other Ambulatory Visit: Payer: Self-pay | Admitting: Nurse Practitioner

## 2017-07-27 ENCOUNTER — Ambulatory Visit: Payer: BLUE CROSS/BLUE SHIELD | Admitting: Nurse Practitioner

## 2021-09-18 DIAGNOSIS — F4310 Post-traumatic stress disorder, unspecified: Secondary | ICD-10-CM | POA: Insufficient documentation

## 2021-09-18 DIAGNOSIS — K279 Peptic ulcer, site unspecified, unspecified as acute or chronic, without hemorrhage or perforation: Secondary | ICD-10-CM | POA: Insufficient documentation

## 2021-09-18 DIAGNOSIS — F330 Major depressive disorder, recurrent, mild: Secondary | ICD-10-CM | POA: Insufficient documentation

## 2021-09-18 DIAGNOSIS — F419 Anxiety disorder, unspecified: Secondary | ICD-10-CM | POA: Insufficient documentation

## 2022-02-10 DIAGNOSIS — Z87898 Personal history of other specified conditions: Secondary | ICD-10-CM | POA: Insufficient documentation

## 2022-07-28 DIAGNOSIS — M5481 Occipital neuralgia: Secondary | ICD-10-CM | POA: Insufficient documentation

## 2022-08-25 DIAGNOSIS — M47812 Spondylosis without myelopathy or radiculopathy, cervical region: Secondary | ICD-10-CM | POA: Insufficient documentation

## 2023-05-18 ENCOUNTER — Ambulatory Visit: Payer: BC Managed Care – PPO

## 2023-05-18 ENCOUNTER — Ambulatory Visit
Admission: EM | Admit: 2023-05-18 | Discharge: 2023-05-18 | Disposition: A | Discharge status: Home / Self Care | Payer: BC Managed Care – PPO

## 2023-05-18 DIAGNOSIS — R053 Chronic cough: Secondary | ICD-10-CM

## 2023-05-18 DIAGNOSIS — R059 Cough, unspecified: Secondary | ICD-10-CM | POA: Diagnosis not present

## 2023-05-18 MED ORDER — PROMETHAZINE-DM 6.25-15 MG/5ML PO SYRP: 5.0000 mL | ORAL_SOLUTION | Freq: Four times a day (QID) | ORAL | 0 refills | Status: AC | PRN

## 2023-05-18 NOTE — Discharge Instructions (Signed)
Chest x-ray is pending.  I have prescribed you a cough medication to take as needed.  Please be advised that it can make you sleepy so do not drive or drink alcohol while taking it.  Referral to pulmonology has been placed.  Also follow-up with primary care doctor.

## 2023-05-18 NOTE — ED Provider Notes (Signed)
[facility]    CSN: [id number] Arrival date & time: 05/18/23  1547      History   Chief Complaint No chief complaint on file.   HPI Jillian Valdez is a 42 y.o. female.   Patient presents today with persistent cough that has been present since June.  Reports that she has been evaluated multiple times and been on several different medications with no improvement.  Patient was seen when it first started and prescribed Tessalon Perles and albuterol inhaler with no improvement.  She was seen on 10/22 and prescribed Augmentin, Symbicort, prednisone steroid taper with no improvement.  Reports that her PCP just recently called in azithromycin and another steroid taper which she just completed today with no improvement.  Cough is productive and is causing chest discomfort bilaterally to her ribs along with some intermittent shortness of breath.  Patient states that they are concerned that there is mold at her workplace as several other coworkers have had similar symptoms.  Workplace is in the process of checking for mold at this time. She has not yet had a chest x-ray. She denies that she smokes cigarettes.      Past Medical History:  Diagnosis Date   Allergy    RHINITIS   Headache    History of migraines     Patient Active Problem List   Diagnosis Date Noted   Left arm numbness 02/23/2017   Chronic neck pain 03/17/2016   Migraine 12/07/2015   Nausea without vomiting 12/07/2015    History reviewed. No pertinent surgical history.  OB History   No obstetric history on file.      Home Medications    Prior to Admission medications   Medication Sig Start Date End Date Taking? Authorizing Provider  azithromycin (ZITHROMAX) 500 MG tablet Take by mouth. 05/15/23 05/20/23 Yes [provider]  FLUoxetine HCl 60 MG TABS Take by mouth. 06/02/22  Yes [provider]  predniSONE (DELTASONE) 10 MG tablet Take 6 tabs x 1 day, then 5 tabs x 1 day, then 4 tabs x  1 day, then 3 tabs x 1 day, then 2 tabs x 1 day, then 1 tab x 1 day, then stop 05/15/23  Yes [provider]  promethazine-dextromethorphan (PROMETHAZINE-DM) 6.25-15 MG/5ML syrup Take 5 mLs by mouth every 6 (six) hours as needed for cough. 05/18/23  Yes Aliciana Ricciardi, Rolly Salter E, FNP  acetaminophen (TYLENOL) 325 MG tablet Take 650 mg by mouth every 6 (six) hours as needed.    [provider]  diphenhydrAMINE (BENADRYL) 25 mg capsule Take 25 mg by mouth daily as needed.     [provider]  tiZANidine (ZANAFLEX) 4 MG tablet Take 1 tablet (4 mg total) by mouth every 8 (eight) hours as needed for muscle spasms. 03/06/17   Nilda Riggs, NP    Family History Family History  Problem Relation Age of Onset   Hypertension Father    Hypertension Mother    Migraines Sister    Cancer Maternal Aunt        breast   Diabetes Maternal Grandmother     Social History Social History   Tobacco Use   Smoking status: Never   Smokeless tobacco: Never  Substance Use Topics   Alcohol use: No    Alcohol/week: 0.0 standard drinks of alcohol   Drug use: No     Allergies   Sulfa antibiotics and Gabapentin   Review of Systems Review of Systems Per HPI  Physical Exam Triage  Vital Signs ED Triage Vitals  Encounter Vitals Group     BP 05/18/23 1607 121/79     Systolic BP Percentile --      Diastolic BP Percentile --      Pulse Rate 05/18/23 1607 81     Resp 05/18/23 1607 20     Temp 05/18/23 1607 98 F (36.7 C)     Temp Source 05/18/23 1607 Oral     SpO2 05/18/23 1607 98 %     Weight --      Height --      Head Circumference --      Peak Flow --      Pain Score 05/18/23 1605 8     Pain Loc --      Pain Education --      Exclude from Growth Chart --    No data found.  Updated Vital Signs BP 121/79 (BP Location: Right Arm)   Pulse 81   Temp 98 F (36.7 C) (Oral)   Resp 20   LMP  (LMP Unknown)   SpO2 98%   Visual Acuity Right Eye Distance:   Left Eye  Distance:   Bilateral Distance:    Right Eye Near:   Left Eye Near:    Bilateral Near:     Physical Exam Constitutional:      General: She is not in acute distress.    Appearance: Normal appearance. She is not toxic-appearing or diaphoretic.  HENT:     Head: Normocephalic and atraumatic.  Eyes:     Extraocular Movements: Extraocular movements intact.     Conjunctiva/sclera: Conjunctivae normal.  Cardiovascular:     Rate and Rhythm: Normal rate and regular rhythm.     Pulses: Normal pulses.     Heart sounds: Normal heart sounds.  Pulmonary:     Effort: Pulmonary effort is normal. No respiratory distress.     Breath sounds: Normal breath sounds. No stridor. No wheezing, rhonchi or rales.     Comments: Harsh coughing noted.  Neurological:     General: No focal deficit present.     Mental Status: She is alert and oriented to person, place, and time. Mental status is at baseline.  Psychiatric:        Mood and Affect: Mood normal.        Behavior: Behavior normal.        Thought Content: Thought content normal.        Judgment: Judgment normal.      UC Treatments / Results  Labs (all labs ordered are listed, but only abnormal results are displayed) Labs Reviewed - No data to display  EKG   Radiology DG Chest 2 View  Result Date: 05/18/2023 CLINICAL DATA:  Cough. EXAM: CHEST - 2 VIEW COMPARISON:  09/02/2016 FINDINGS: The cardiomediastinal contours are normal. Minimal bronchial thickening. Pulmonary vasculature is normal. No consolidation, pleural effusion, or pneumothorax. No acute osseous abnormalities are seen. IMPRESSION: Minimal bronchial thickening. No consolidation. Electronically Signed   By: Narda Rutherford M.D.   On: 05/18/2023 18:42    Procedures Procedures (including critical care time)  Medications Ordered in UC Medications - No data to display  Initial Impression / Assessment and Plan / UC Course  I have reviewed the triage vital signs and the nursing  notes.  Pertinent labs & imaging results that were available during my care of the patient were reviewed by me and considered in my medical decision making (see chart for details).  Chest x-ray showing minimal bronchial thickening but no acute signs of pneumonia.  Patient has already been on multiple antibiotics and multiple doses of prednisone so will defer this.  Patient prescribed Promethazine DM to take as needed for cough.  She denies that she takes any daily medications so this should should be safe.  I did educate patient that this medication can make her drowsy and do not drive or drink alcohol taking it.  Advised strict follow-up with pulmonology.  Ambulatory referral to pulmonology was placed and patient was advised of PCP follow-up.  There are no adventitious lung sounds on exam, oxygen is normal and there is no tachypnea so do not think that emergent evaluation is necessary.  Patient verbalized understanding and was agreeable with plan.  Attempted to call patient to notify of x-ray results but there is no answer.  Left voicemail for call back per privacy policy. Final Clinical Impressions(s) / UC Diagnoses   Final diagnoses:  Persistent cough for 3 weeks or longer     Discharge Instructions      Chest x-ray is pending.  I have prescribed you a cough medication to take as needed.  Please be advised that it can make you sleepy so do not drive or drink alcohol while taking it.  Referral to pulmonology has been placed.  Also follow-up with primary care doctor.    ED Prescriptions     Medication Sig Dispense Auth. Provider   promethazine-dextromethorphan (PROMETHAZINE-DM) 6.25-15 MG/5ML syrup Take 5 mLs by mouth every 6 (six) hours as needed for cough. 118 mL Gustavus Bryant, [place]      PDMP not reviewed this encounter.   Gustavus Bryant, [place] 05/18/23 1859

## 2023-05-18 NOTE — ED Triage Notes (Signed)
Pt reports she has a cough and lung pain   Has been on multiple courses of prednisone and antibiotics

## 2023-11-11 ENCOUNTER — Ambulatory Visit: Admission: EM | Admit: 2023-11-11 | Discharge: 2023-11-11 | Disposition: A | Discharge status: Home / Self Care

## 2023-11-11 ENCOUNTER — Encounter: Payer: Self-pay | Admitting: Emergency Medicine

## 2023-11-11 DIAGNOSIS — K279 Peptic ulcer, site unspecified, unspecified as acute or chronic, without hemorrhage or perforation: Secondary | ICD-10-CM | POA: Diagnosis not present

## 2023-11-11 DIAGNOSIS — J309 Allergic rhinitis, unspecified: Secondary | ICD-10-CM | POA: Insufficient documentation

## 2023-11-11 MED ORDER — ONDANSETRON 4 MG PO TBDP
4.0000 mg | ORAL_TABLET | Freq: Once | ORAL | Status: AC
  Administered 2023-11-11: 4 mg via ORAL

## 2023-11-11 MED ORDER — ONDANSETRON HCL 4 MG PO TABS: 4.0000 mg | ORAL_TABLET | Freq: Three times a day (TID) | ORAL | 0 refills | Status: AC | PRN

## 2023-11-11 MED ORDER — SUCRALFATE 1 G PO TABS: 1.0000 g | ORAL_TABLET | Freq: Three times a day (TID) | ORAL | 0 refills | Status: AC | PRN

## 2023-11-11 NOTE — Discharge Instructions (Addendum)
 Follow-up with your primary care provider to discuss a referral to gastroenterologist if you feel your symptoms are worsening or occurring more frequently despite current treatment.

## 2023-11-11 NOTE — ED Provider Notes (Signed)
 [facility]    CSN: [id number] Arrival date & time: 11/11/23  0804      History   Chief Complaint Chief Complaint  Patient presents with   Abdominal Pain    HPI Jillian Valdez is a 43 y.o. female.    Abdominal Pain Patient with a peptic ulcer disease presents today with a 1 week history of nausea vomiting and GI upset.  She is prescribed Carafate by her PCP but is currently out and has no additional refills. Endorses that she vomited 30 minutes ago.  She is followed by PCP through [facility].  Currently feels that current treatment is not working to all symptoms of peptic ulcer disease.   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 No pertinent surgical history.  OB History   No obstetric history on file.      Home Medications    Prior to Admission medications   Medication Sig Start Date End Date Taking? Authorizing Provider  diphenhydrAMINE (BENADRYL) 25 mg capsule Take 25 mg by mouth daily as needed.    Yes [provider]  levonorgestrel (MIRENA, 52 MG,) 20 MCG/DAY IUD by Intrauterine route. 04/27/19  Yes [provider]  pantoprazole (PROTONIX) 40 MG tablet Take 40 mg by mouth. 06/02/22  Yes [provider]  sucralfate (CARAFATE) 1 g tablet TAKE 1 TABLET(1 GRAM) BY MOUTH TWICE DAILY AS NEEDED 07/01/22  Yes [provider]  albuterol (VENTOLIN HFA) 108 (90 Base) MCG/ACT inhaler 2 puffs Inhalation q 4-6 hours prn cough/wheeze for 90 days Patient not taking: Reported on 11/11/2023    [provider]  budesonide-formoterol (SYMBICORT) 160-4.5 MCG/ACT inhaler Inhale 2 puffs into the lungs 2 (two) times daily. Patient not taking: Reported on 11/11/2023 06/08/23   [provider]  clobetasol (OLUX) 0.05 % topical foam  11/17/16   [provider]  Doxepin HCl 6 MG TABS Take 6 mg by mouth. Patient not taking: Reported on 11/11/2023 06/16/22   [provider]  FLUoxetine HCl 60 MG TABS Take by mouth. Patient not taking: Reported on 11/11/2023 06/02/22   [provider]  ketoconazole (NIZORAL) 2 % shampoo APP AA UTD Patient not taking: Reported on 11/11/2023 11/17/16   [provider]  levocetirizine (XYZAL) 5 MG tablet 1 tablet in the evening Orally Once a day for 30 days Patient not taking: Reported on 11/11/2023 05/27/23   [provider]  Olopatadine HCl 0.6 % SOLN 2 sprays in each nostril Nasally Twice a day for 30 days Patient not taking: Reported on 11/11/2023 05/27/23   [provider]  ondansetron  (ZOFRAN -ODT) 4 MG disintegrating tablet Take 2 tablets every 12 hours by oral route as needed. Patient not taking: Reported on 11/11/2023 11/24/17   [provider]  predniSONE (DELTASONE) 10 MG tablet Take 6 tabs x 1 day, then 5 tabs x  1 day, then 4 tabs x 1 day, then 3 tabs x 1 day, then 2 tabs x 1 day, then 1 tab x 1 day, then stop Patient not taking: Reported on 11/11/2023 05/15/23   [provider]  promethazine -dextromethorphan (PROMETHAZINE -DM) 6.25-15 MG/5ML syrup Take 5 mLs by mouth every 6 (six) hours as needed for cough. Patient not taking: Reported on 11/11/2023 05/18/23   Dodson Freestone, FNP  tiZANidine  (ZANAFLEX ) 4 MG tablet Take 1 tablet (4 mg total) by mouth every 8 (eight) hours as needed for muscle spasms. Patient not  taking: Reported on 11/11/2023 03/06/17   Raquel Cables, NP  tiZANidine  (ZANAFLEX ) 4 MG tablet Take 4 mg by mouth. Patient not taking: Reported on 11/11/2023    [provider]    Family History Family History  Problem Relation Age of Onset   Hypertension Father    Hypertension Mother    Migraines Sister    Cancer Maternal Aunt        breast   Diabetes Maternal Grandmother     Social History Social History   Tobacco Use   Smoking status: Never   Smokeless tobacco: Never  Vaping Use   Vaping status: Never Used  Substance Use Topics   Alcohol use: No    Alcohol/week: 0.0 standard drinks of alcohol   Drug use: No     Allergies   Sulfa antibiotics and Gabapentin    Review of Systems Review of Systems  Gastrointestinal:  Positive for abdominal pain.     Physical Exam Triage Vital Signs ED Triage Vitals  Encounter Vitals Group     BP 11/11/23 0831 120/76     Systolic BP Percentile --      Diastolic BP Percentile --      Pulse Rate 11/11/23 0831 81     Resp 11/11/23 0831 16     Temp 11/11/23 0831 97.6 F (36.4 C)     Temp Source 11/11/23 0831 Oral     SpO2 11/11/23 0831 97 %     Weight --      Height --      Head Circumference --      Peak Flow --      Pain Score 11/11/23 0832 9     Pain Loc --      Pain Education --      Exclude from Growth Chart --    No data found.  Updated Vital Signs BP 120/76 (BP Location: Left Arm)   Pulse 81   Temp 97.6 F (36.4 C) (Oral)   Resp 16   SpO2 97%   Visual Acuity Right Eye Distance:   Left Eye Distance:   Bilateral Distance:    Right Eye Near:   Left Eye Near:    Bilateral Near:     Physical Exam Vitals reviewed.  Constitutional:      Appearance: Normal appearance. She is well-developed.  HENT:     Head: Normocephalic.     Nose: Nose normal.  Eyes:     Extraocular Movements: Extraocular movements intact.     Pupils: Pupils are equal, round, and reactive to light.  Cardiovascular:      Rate and Rhythm: Normal rate and regular rhythm.  Pulmonary:     Effort: Pulmonary effort is normal.     Breath sounds: Normal breath sounds.  Abdominal:     General: Abdomen is flat.     Tenderness: There is abdominal tenderness in the epigastric area.  Musculoskeletal:  Cervical back: Normal range of motion and neck supple.  Skin:    General: Skin is warm and dry.  Neurological:     General: No focal deficit present.     Mental Status: She is alert and oriented to person, place, and time.      UC Treatments / Results  Labs (all labs ordered are listed, but only abnormal results are displayed) Labs Reviewed - No data to display  EKG   Radiology No results found.  Procedures Procedures (including critical care time)  Medications Ordered in UC Medications  ondansetron  (ZOFRAN -ODT) disintegrating tablet 4 mg (4 mg Oral Given 11/11/23 0837)    Initial Impression / Assessment and Plan / UC Course  I have reviewed the triage vital signs and the nursing notes.  Pertinent labs & imaging results that were available during my care of the patient were reviewed by me and considered in my medical decision making (see chart for details).    Peptic ulcer disease, advised patient to discuss with PCP possible referral to GI however will continue with current treatment consisting of Carafate for PUD symptoms and Zofran  for nausea. Final Clinical Impressions(s) / UC Diagnoses   Final diagnoses:  Peptic ulcer disease     Discharge Instructions      Follow-up with your primary care provider to discuss a referral to gastroenterologist if you feel your symptoms are worsening or occurring more frequently despite current treatment.   ED Prescriptions   None    PDMP not reviewed this encounter.   Buena Carmine, NP 11/11/23 [phone]

## 2023-11-11 NOTE — ED Triage Notes (Signed)
 Pt reports abdominal pain and emesis episodes x1 week. Pt reports chronic hx of stomach ulcers and was on sucralfate, but ran out last week and issues have continued to fester. Pt reports reaching out to PCP to get refill but it has been a slow process. Pt also concerned that this medication is no longer working as well as it used to. Emesis content is stomach acid per pt. Last episode: 30 mins ago. Currently nauseous. No antiemetics this morning.
